# Patient Record
Sex: Female | Born: 1965 | Race: White | Hispanic: No | Marital: Married | Smoking: Never smoker
Health system: Southern US, Community
[De-identification: ages and names within clinical notes are randomized; demographics above are authoritative.]

## PROBLEM LIST (undated history)

## (undated) DIAGNOSIS — J302 Other seasonal allergic rhinitis: Secondary | ICD-10-CM

## (undated) DIAGNOSIS — M1990 Unspecified osteoarthritis, unspecified site: Secondary | ICD-10-CM

## (undated) HISTORY — PX: BREAST SURGERY: SHX581

---

## 2012-11-10 ENCOUNTER — Ambulatory Visit: Payer: Self-pay

## 2012-11-10 LAB — PREGNANCY, URINE: Pregnancy Test, Urine: NEGATIVE m[IU]/mL
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
## 2015-03-01 ENCOUNTER — Ambulatory Visit
Admission: EM | Admit: 2015-03-01 | Discharge: 2015-03-01 | Disposition: A | Discharge status: Home / Self Care | Attending: Family Medicine | Admitting: Family Medicine

## 2015-03-01 ENCOUNTER — Encounter: Payer: Self-pay | Admitting: Emergency Medicine

## 2015-03-01 DIAGNOSIS — L259 Unspecified contact dermatitis, unspecified cause: Secondary | ICD-10-CM

## 2015-03-01 MED ORDER — RANITIDINE HCL 150 MG PO CAPS: 150.0000 mg | ORAL_CAPSULE | Freq: Two times a day (BID) | ORAL | Status: DC

## 2015-03-01 MED ORDER — PREDNISONE 10 MG (21) PO TBPK: ORAL_TABLET | ORAL | Status: DC

## 2015-03-01 MED ORDER — METHYLPREDNISOLONE SODIUM SUCC 125 MG IJ SOLR
125.0000 mg | Freq: Once | INTRAMUSCULAR | Status: AC
  Administered 2015-03-01: 125 mg via INTRAMUSCULAR

## 2015-03-01 MED ORDER — LORATADINE 10 MG PO TABS: 10.0000 mg | ORAL_TABLET | Freq: Every day | ORAL | Status: DC

## 2015-03-01 MED ORDER — LORATADINE 10 MG PO TABS: 10.0000 mg | ORAL_TABLET | Freq: Every day | ORAL | Status: AC

## 2015-03-01 NOTE — ED Notes (Signed)
Patient states that she has been treated for poison ivy since last Tuesday with Prednisone and Benadryl and states that it will not go away and has gotten worse.  Patient has poison ivy on her arms and hands.

## 2015-03-01 NOTE — ED Provider Notes (Addendum)
CSN: [id number]     Arrival date & time 03/01/15  0857 History   First MD Initiated Contact with Patient 03/01/15 [phone]    Nurses notes were reviewed. Chief Complaint  Patient presents with  . Poison Ivy    Patient reports over week ago being exposed to poison ivy poison oak in the field. She is visiting her father in [place]. She went to an local urgent care and they placed her on a six-day course of prednisone (40-60 mg. Once the prednisone was through the contact dermatitis came back with a vengeance. She was seen at the urgent care on Saturday giving a shot of prednisone she has provided much and then placed on 40 mg prednisone a day for about another 6 days and states it has really touched anything to help anything this time. She is states she is very allergic to poison ivy and poison oak. She states she is miserable the rash is continuing to spread.  (Consider location/radiation/quality/duration/timing/severity/associated sxs/prior Treatment) Patient is a 50 y.o. female presenting with poison ivy. The history is provided by the patient. No language interpreter was used.  Poison Lajoyce Cornersvy This is a new problem. The current episode started more than 1 week ago. The problem occurs constantly. The problem has been gradually worsening. Pertinent negatives include no chest pain, no abdominal pain and no headaches. Nothing aggravates the symptoms. Relieved by: Prednisone initially helped. Treatments tried: Prescribe steroids.    History reviewed. No pertinent past medical history. History reviewed. No pertinent past surgical history. History reviewed. No pertinent family history. Social History  Substance Use Topics  . Smoking status: Never Smoker   . Smokeless tobacco: None  . Alcohol Use: No   OB History    No data available     Review of Systems  Cardiovascular: Negative for chest pain.  Gastrointestinal: Negative for abdominal pain.  Neurological: Negative for headaches.  All other  systems reviewed and are negative.   Allergies  Review of patient's allergies indicates no known allergies.  Home Medications   Prior to Admission medications   Medication Sig Start Date End Date Taking? Authorizing Provider  loratadine (CLARITIN) 10 MG tablet Take 1 tablet (10 mg total) by mouth daily. Take 1 tablet in the morning. As needed for itching. 03/01/15   Hassan RowanEugene Lilyanah Celestin, MD  predniSONE (STERAPRED UNI-PAK 21 TAB) 10 MG (21) TBPK tablet 6 tabs day 1 and 2, 5 tabs day 3 and 4, 4 tabs day 5 and 6, 3 tabs day 7 and 8, 2 tabs day 9 and 10, 1 tab day 11 and 12. Take orally 03/01/15   Hassan RowanEugene Richele Strand, MD  ranitidine (ZANTAC) 150 MG capsule Take 1 capsule (150 mg total) by mouth 2 (two) times daily. 03/01/15   Hassan RowanEugene Marquavis Hannen, MD   Meds Ordered and Administered this Visit   Medications  methylPREDNISolone sodium succinate (SOLU-MEDROL) 125 mg/2 mL injection 125 mg (not administered)    BP 116/83 mmHg  Pulse 73  Temp(Src) 97 F (36.1 C) (Tympanic)  Resp 16  Ht 5\' 8"  (1.727 m)  Wt 199 lb (90.266 kg)  BMI 30.26 kg/m2  SpO2 100%  LMP 01/30/2015 (Exact Date) No data found.   Physical Exam  Constitutional: She is oriented to person, place, and time. She appears well-developed and well-nourished.  HENT:  Head: Normocephalic and atraumatic.  Eyes: Pupils are equal, round, and reactive to light.  Neck: Normal range of motion.  Musculoskeletal: Normal range of motion.  Neurological: She is alert and  oriented to person, place, and time. No cranial nerve deficit.  Skin: Rash noted. There is erythema.     Psychiatric: She has a normal mood and affect. Her behavior is normal.  Vitals reviewed.   ED Course  Procedures (including critical care time)  Labs Review Labs Reviewed - No data to display  Imaging Review No results found.   Visual Acuity Review  Right Eye Distance:   Left Eye Distance:   Bilateral Distance:    Right Eye Near:   Left Eye Near:    Bilateral Near:          MDM   1. Contact dermatitis    Will administer 125 mg of Solu-Medrol IM now. Place on 12 days decreasing dose of prednisone. Strongly suggest that the future she lets providers know that six-day course of prednisone will not be adequate to treat contact dermatitis. Will also place her on Zantac 150 twice a day and Claritin 10 mg morning and she can take Vistaril at night or Zyrtec at night but cannot take Benadryl. Work note for today and tomorrow be given as well. I've asked her to come back Friday for recheck to make sure that we her improving the situation.    Hassan Rowan, MD 03/01/15 1610  Hassan Rowan, MD 03/01/15 [phone]

## 2015-03-01 NOTE — Discharge Instructions (Signed)
Contact Dermatitis °Dermatitis is redness, soreness, and swelling (inflammation) of the skin. Contact dermatitis is a reaction to certain substances that touch the skin. You either touched something that irritated your skin, or you have allergies to something you touched.  °HOME CARE  °Skin Care °· Moisturize your skin as needed. °· Apply cool compresses to the affected areas.   °· Try taking a bath with:   °· Epsom salts. Follow the instructions on the package. You can get these at a pharmacy or grocery store.   °· Baking soda. Pour a small amount into the bath as told by your doctor.   °· Colloidal oatmeal. Follow the instructions on the package. You can get this at a pharmacy or grocery store.   °· Try applying baking soda paste to your skin. Stir water into baking soda until it looks like paste. °· Do not scratch your skin.   °· Bathe less often. °· Bathe in lukewarm water. Avoid using hot water.   °Medicines °· Take or apply over-the-counter and prescription medicines only as told by your doctor.   °· If you were prescribed an antibiotic medicine, take or apply your antibiotic as told by your doctor. Do not stop taking the antibiotic even if your condition starts to get better. °General Instructions °· Keep all follow-up visits as told by your doctor. This is important.   °· Avoid the substance that caused your reaction. If you do not know what caused it, keep a journal to try to track what caused it. Write down:   °· What you eat.   °· What cosmetic products you use.   °· What you drink.   °· What you wear in the affected area. This includes jewelry.   °· If you were given a bandage (dressing), take care of it as told by your doctor. This includes when to change and remove it.   °GET HELP IF:  °· You do not get better with treatment.   °· Your condition gets worse.   °· You have signs of infection such as: °¨ Swelling. °¨ Tenderness. °¨ Redness. °¨ Soreness. °¨ Warmth.   °· You have a fever.   °· You have new  symptoms.   °GET HELP RIGHT AWAY IF:  °· You have a very bad headache. °· You have neck pain. °· Your neck is stiff.    °· You throw up (vomit).   °· You feel very sleepy.   °· You see red streaks coming from the affected area.   °· Your bone or joint underneath the affected area becomes painful after the skin has healed.   °· The affected area turns darker.   °· You have trouble breathing.   °  °This information is not intended to replace advice given to you by your health care provider. Make sure you discuss any questions you have with your health care provider. °  °Document Released: 12/10/2008 Document Revised: 11/03/2014 Document Reviewed: 06/30/2014 °[employer] Interactive Patient Education ©2016 [employer] ° °Rash °A rash is a change in the color or feel of your skin. There are many different types of rashes. You may have other problems along with your rash. °HOME CARE °· Avoid the thing that caused your rash. °· Do not scratch your rash. °· You may take cools baths to help stop itching. °· Only take medicines as told by your doctor. °· Keep all doctor visits as told. °GET HELP RIGHT AWAY IF:  °· Your pain, puffiness (swelling), or redness gets worse. °· You have a fever. °· You have new or severe problems. °· You have body aches, watery poop (diarrhea), or you throw   up (vomit). °· Your rash is not better after 3 days. °MAKE SURE YOU:  °· Understand these instructions. °· Will watch your condition. °· Will get help right away if you are not doing well or get worse. °  °This information is not intended to replace advice given to you by your health care provider. Make sure you discuss any questions you have with your health care provider. °  °Document Released: 08/01/2007 Document Revised: 05/07/2011 Document Reviewed: 06/30/2014 °[employer] Interactive Patient Education ©2016 [employer] ° °

## 2016-07-30 ENCOUNTER — Telehealth: Payer: Self-pay | Admitting: Emergency Medicine

## 2016-07-30 ENCOUNTER — Ambulatory Visit
Admission: EM | Admit: 2016-07-30 | Discharge: 2016-07-30 | Disposition: A | Discharge status: Home / Self Care | Attending: Family Medicine | Admitting: Family Medicine

## 2016-07-30 ENCOUNTER — Encounter: Payer: Self-pay | Admitting: Emergency Medicine

## 2016-07-30 ENCOUNTER — Ambulatory Visit
Admit: 2016-07-30 | Discharge: 2016-07-30 | Disposition: A | Discharge status: Home / Self Care | Source: Ambulatory Visit | Attending: Family Medicine | Admitting: Family Medicine

## 2016-07-30 DIAGNOSIS — R102 Pelvic and perineal pain: Secondary | ICD-10-CM | POA: Insufficient documentation

## 2016-07-30 DIAGNOSIS — Z975 Presence of (intrauterine) contraceptive device: Secondary | ICD-10-CM | POA: Insufficient documentation

## 2016-07-30 DIAGNOSIS — N730 Acute parametritis and pelvic cellulitis: Secondary | ICD-10-CM | POA: Diagnosis not present

## 2016-07-30 DIAGNOSIS — R109 Unspecified abdominal pain: Secondary | ICD-10-CM

## 2016-07-30 LAB — CBC WITH DIFFERENTIAL/PLATELET
Basophils Absolute: 0.1 10*3/uL (ref 0–0.1)
Basophils Relative: 1 %
EOS ABS: 0.2 10*3/uL (ref 0–0.7)
EOS PCT: 2 %
HCT: 38.2 % (ref 35.0–47.0)
Hemoglobin: 13.1 g/dL (ref 12.0–16.0)
LYMPHS ABS: 2 10*3/uL (ref 1.0–3.6)
LYMPHS PCT: 19 %
MCH: 29.7 pg (ref 26.0–34.0)
MCHC: 34.2 g/dL (ref 32.0–36.0)
MCV: 87 fL (ref 80.0–100.0)
MONO ABS: 1 10*3/uL — AB (ref 0.2–0.9)
MONOS PCT: 10 %
Neutro Abs: 6.9 10*3/uL — ABNORMAL HIGH (ref 1.4–6.5)
Neutrophils Relative %: 68 %
PLATELETS: 218 10*3/uL (ref 150–440)
RBC: 4.4 MIL/uL (ref 3.80–5.20)
RDW: 13.6 % (ref 11.5–14.5)
WBC: 10.2 10*3/uL (ref 3.6–11.0)

## 2016-07-30 LAB — URINALYSIS, COMPLETE (UACMP) WITH MICROSCOPIC
BACTERIA UA: NONE SEEN
Bilirubin Urine: NEGATIVE
GLUCOSE, UA: NEGATIVE mg/dL
KETONES UR: NEGATIVE mg/dL
LEUKOCYTES UA: NEGATIVE
Nitrite: NEGATIVE
Protein, ur: NEGATIVE mg/dL
Specific Gravity, Urine: 1.02 (ref 1.005–1.030)
pH: 7 (ref 5.0–8.0)

## 2016-07-30 LAB — PREGNANCY, URINE: Preg Test, Ur: NEGATIVE

## 2016-07-30 LAB — WET PREP, GENITAL
CLUE CELLS WET PREP: NONE SEEN
SPERM: NONE SEEN
TRICH WET PREP: NONE SEEN
WBC WET PREP: NONE SEEN
YEAST WET PREP: NONE SEEN

## 2016-07-30 LAB — CHLAMYDIA/NGC RT PCR (ARMC ONLY)
Chlamydia Tr: NOT DETECTED
N gonorrhoeae: NOT DETECTED

## 2016-07-30 MED ORDER — METRONIDAZOLE 500 MG PO TABS: 500.0000 mg | ORAL_TABLET | Freq: Two times a day (BID) | ORAL | 0 refills | Status: DC

## 2016-07-30 MED ORDER — CEFTRIAXONE SODIUM 1 G IJ SOLR
1.0000 g | Freq: Once | INTRAMUSCULAR | Status: AC
  Administered 2016-07-30: 1 g via INTRAMUSCULAR

## 2016-07-30 MED ORDER — DOXYCYCLINE HYCLATE 100 MG PO CAPS: 100.0000 mg | ORAL_CAPSULE | Freq: Two times a day (BID) | ORAL | 0 refills | Status: DC

## 2016-07-30 MED ORDER — HYDROCODONE-ACETAMINOPHEN 5-325 MG PO TABS: 1.0000 | ORAL_TABLET | Freq: Three times a day (TID) | ORAL | 0 refills | Status: DC | PRN

## 2016-07-30 MED ORDER — DOXYCYCLINE HYCLATE 100 MG PO CAPS
100.0000 mg | ORAL_CAPSULE | Freq: Two times a day (BID) | ORAL | 0 refills | Status: DC
Start: 2016-07-30 — End: 2016-07-30

## 2016-07-30 NOTE — ED Notes (Signed)
Patient shows no signs of adverse reaction to medication at this time.  

## 2016-07-30 NOTE — ED Triage Notes (Signed)
Patient states she has been having abdominal pain x 2 days which is worse with movement

## 2016-07-30 NOTE — ED Provider Notes (Addendum)
[facility]    CSN: [id number] Arrival date & time: 07/30/16  1529     History   Chief Complaint Chief Complaint  Patient presents with  . Abdominal Pain    HPI Catherine Byrd is a 51 y.o. female.   Patient reports having lower abdominal pain on the left side that started this morning. She reports no problem yesterday. No history of medical problems. No history of diverticulitis. She denies any history of ovarian cyst or ovarian torsion pelvic inflammatory disease or discharge. She was last sexually active Saturday night usual partner and she states that Monday morning she started having this abdominal pain was sharp and severe. She denies any drug allergies. Habits she does not smoke. She is retired from [employer]. She has had hip surgery for the past. Mother said cancer mother is also had torsion of the ovaries and her sisters had ovarian cyst before the past. She never did smoke. She reports no problem with her appetite she's had some nausea but no vomiting. No fever.   The history is provided by the patient. No language interpreter was used.  Abdominal Pain  Pain location:  Suprapubic Pain quality: sharp, stabbing and throbbing   Pain radiates to:  Does not radiate Pain severity:  Severe Onset quality:  Sudden Duration:  1 day Timing:  Constant Progression:  Worsening Chronicity:  New Context: recent sexual activity   Context: not awakening from sleep, not medication withdrawal, not previous surgeries, not recent illness, not sick contacts and not suspicious food intake   Relieved by:  Nothing Ineffective treatments:  None tried Associated symptoms: no dysuria, no fever, no flatus, no vaginal bleeding and no vaginal discharge     History reviewed. No pertinent past medical history.  There are no active problems to display for this patient.   History reviewed. No pertinent surgical history.  OB History    No data available       Home Medications     Prior to Admission medications   Medication Sig Start Date End Date Taking? Authorizing Provider  doxycycline (VIBRAMYCIN) 100 MG capsule Take 1 capsule (100 mg total) by mouth 2 (two) times daily. 07/30/16   Hassan Rowan, MD  HYDROcodone-acetaminophen (NORCO) 5-325 MG tablet Take 1 tablet by mouth every 8 (eight) hours as needed for moderate pain. 07/30/16   Hassan Rowan, MD  loratadine (CLARITIN) 10 MG tablet Take 1 tablet (10 mg total) by mouth daily. Take 1 tablet in the morning. As needed for itching. 03/01/15   Hassan Rowan, MD  metroNIDAZOLE (FLAGYL) 500 MG tablet Take 1 tablet (500 mg total) by mouth 2 (two) times daily. 07/30/16   Hassan Rowan, MD  predniSONE (STERAPRED UNI-PAK 21 TAB) 10 MG (21) TBPK tablet 6 tabs day 1 and 2, 5 tabs day 3 and 4, 4 tabs day 5 and 6, 3 tabs day 7 and 8, 2 tabs day 9 and 10, 1 tab day 11 and 12. Take orally 03/01/15   Hassan Rowan, MD  ranitidine (ZANTAC) 150 MG capsule Take 1 capsule (150 mg total) by mouth 2 (two) times daily. 03/01/15   Hassan Rowan, MD    Family History Family History  Problem Relation Age of Onset  . Cancer Mother     Social History Social History  Substance Use Topics  . Smoking status: Never Smoker  . Smokeless tobacco: Never Used  . Alcohol use No     Allergies   Patient has no known  allergies.   Review of Systems Review of Systems  Constitutional: Negative for fever.  Gastrointestinal: Positive for abdominal pain. Negative for flatus.  Genitourinary: Positive for pelvic pain. Negative for dysuria, vaginal bleeding and vaginal discharge.  All other systems reviewed and are negative.    Physical Exam Triage Vital Signs ED Triage Vitals  Enc Vitals Group     BP 07/30/16 1603 115/70     Pulse Rate 07/30/16 1603 91     Resp 07/30/16 1603 18     Temp 07/30/16 1603 99.2 F (37.3 C)     Temp src --      SpO2 07/30/16 1603 99 %     Weight 07/30/16 1557 221 lb (100.2 kg)     Height 07/30/16 1557 5\' 8"  (1.727 m)      Head Circumference --      Peak Flow --      Pain Score 07/30/16 1557 6     Pain Loc --      Pain Edu? --      Excl. in GC? --    No data found.   Updated Vital Signs BP 115/70   Pulse 91   Temp 99.2 F (37.3 C)   Resp 18   Ht 5\' 8"  (1.727 m)   Wt 221 lb (100.2 kg)   SpO2 99%   BMI 33.60 kg/m   Visual Acuity Right Eye Distance:   Left Eye Distance:   Bilateral Distance:    Right Eye Near:   Left Eye Near:    Bilateral Near:     Physical Exam  Constitutional: She is oriented to person, place, and time. She appears well-developed and well-nourished.  HENT:  Head: Normocephalic and atraumatic.  Right Ear: External ear normal.  Left Ear: External ear normal.  Mouth/Throat: Oropharynx is clear and moist.  Eyes: Pupils are equal, round, and reactive to light.  Neck: Normal range of motion. Neck supple.  Cardiovascular: Normal rate, regular rhythm and normal heart sounds.   Pulmonary/Chest: Effort normal and breath sounds normal.  Abdominal: Soft. There is tenderness. Hernia confirmed negative in the right inguinal area and confirmed negative in the left inguinal area.  Genitourinary: Rectum normal. Rectal exam shows no external hemorrhoid and no fissure. There is no rash or tenderness on the right labia. There is no rash or tenderness on the left labia. Uterus is tender. Cervix exhibits discharge. Cervix exhibits no motion tenderness and no friability. Right adnexum displays no mass, no tenderness and no fullness. Left adnexum displays tenderness and fullness. Left adnexum displays no mass. Vaginal discharge found.  Genitourinary Comments: Patient with marked left adnexal tenderness and pain and depression cervix. Rectal examination also confirms tenderness over the left adnexal area as well. IUD was present and discharged present around IUD  Musculoskeletal: Normal range of motion. She exhibits no edema.  Neurological: She is alert and oriented to person, place, and time.    Skin: Skin is warm and dry.  Psychiatric: She has a normal mood and affect.  Vitals reviewed.    UC Treatments / Results  Labs (all labs ordered are listed, but only abnormal results are displayed) Labs Reviewed  URINALYSIS, COMPLETE (UACMP) WITH MICROSCOPIC - Abnormal; Notable for the following:       Result Value   Hgb urine dipstick TRACE (*)    Squamous Epithelial / LPF 0-5 (*)    All other components within normal limits  CBC WITH DIFFERENTIAL/PLATELET - Abnormal; Notable for the following:  Neutro Abs 6.9 (*)    Monocytes Absolute 1.0 (*)    All other components within normal limits  WET PREP, GENITAL  CHLAMYDIA/NGC RT PCR ([facility] ONLY)  PREGNANCY, URINE    EKG  EKG Interpretation None       Radiology US Transvaginal Non-ob  Result Date: 07/30/2016 CLINICAL DATA:  Left pelvic pain. EXAM: TRANSABDOMINAL AND TRANSVAGINAL ULTRASOUND OF PELVIS DOPPLER ULTRASOUND OF OVARIES TECHNIQUE: Both transabdominal and transvaginal ultrasound examinations of the pelvis were performed. Transabdominal technique was performed for global imaging of the pelvis including uterus, ovaries, adnexal regions, and pelvic cul-de-sac. It was necessary to proceed with endovaginal exam following the transabdominal exam to visualize the endometrium, ovaries, and adnexum. Color and duplex Doppler ultrasound was utilized to evaluate blood flow to the ovaries. COMPARISON:  None. FINDINGS: Uterus Measurements: 9.3 x 4.6 x 5.6 cm. 1 cm hypoechoic lesion in the uterine fundus consistent with a small fibroid. Endometrium Thickness: 5.2 mm.  IUD appears to be in the uterine fundus. Right ovary Measurements: 2.7 x 1.5 x 1.4 cm. Normal appearance/no adnexal mass. Left ovary Measurements: 4.2 x 2.6 x 2.4 cm. Normal appearance/no adnexal mass. Pulsed Doppler evaluation of both ovaries demonstrates normal low-resistance arterial and venous waveforms. Other findings Small amount of free fluid in the pelvis.  IMPRESSION: IUD in the uterine fundus. Otherwise normal exam. Normal perfusion to both ovaries. Electronically Signed   By: Francene Boyers M.D.   On: 07/30/2016 19:50   US Pelvis Complete  Result Date: 07/30/2016 CLINICAL DATA:  Left pelvic pain. EXAM: TRANSABDOMINAL AND TRANSVAGINAL ULTRASOUND OF PELVIS DOPPLER ULTRASOUND OF OVARIES TECHNIQUE: Both transabdominal and transvaginal ultrasound examinations of the pelvis were performed. Transabdominal technique was performed for global imaging of the pelvis including uterus, ovaries, adnexal regions, and pelvic cul-de-sac. It was necessary to proceed with endovaginal exam following the transabdominal exam to visualize the endometrium, ovaries, and adnexum. Color and duplex Doppler ultrasound was utilized to evaluate blood flow to the ovaries. COMPARISON:  None. FINDINGS: Uterus Measurements: 9.3 x 4.6 x 5.6 cm. 1 cm hypoechoic lesion in the uterine fundus consistent with a small fibroid. Endometrium Thickness: 5.2 mm.  IUD appears to be in the uterine fundus. Right ovary Measurements: 2.7 x 1.5 x 1.4 cm. Normal appearance/no adnexal mass. Left ovary Measurements: 4.2 x 2.6 x 2.4 cm. Normal appearance/no adnexal mass. Pulsed Doppler evaluation of both ovaries demonstrates normal low-resistance arterial and venous waveforms. Other findings Small amount of free fluid in the pelvis. IMPRESSION: IUD in the uterine fundus. Otherwise normal exam. Normal perfusion to both ovaries. Electronically Signed   By: Francene Boyers M.D.   On: 07/30/2016 19:50   [place] Art/ven Flow Abd Pelv Doppler  Result Date: 07/30/2016 CLINICAL DATA:  Left pelvic pain. EXAM: TRANSABDOMINAL AND TRANSVAGINAL ULTRASOUND OF PELVIS DOPPLER ULTRASOUND OF OVARIES TECHNIQUE: Both transabdominal and transvaginal ultrasound examinations of the pelvis were performed. Transabdominal technique was performed for global imaging of the pelvis including uterus, ovaries, adnexal regions, and pelvic cul-de-sac. It  was necessary to proceed with endovaginal exam following the transabdominal exam to visualize the endometrium, ovaries, and adnexum. Color and duplex Doppler ultrasound was utilized to evaluate blood flow to the ovaries. COMPARISON:  None. FINDINGS: Uterus Measurements: 9.3 x 4.6 x 5.6 cm. 1 cm hypoechoic lesion in the uterine fundus consistent with a small fibroid. Endometrium Thickness: 5.2 mm.  IUD appears to be in the uterine fundus. Right ovary Measurements: 2.7 x 1.5 x 1.4 cm. Normal appearance/no adnexal mass. Left  ovary Measurements: 4.2 x 2.6 x 2.4 cm. Normal appearance/no adnexal mass. Pulsed Doppler evaluation of both ovaries demonstrates normal low-resistance arterial and venous waveforms. Other findings Small amount of free fluid in the pelvis. IMPRESSION: IUD in the uterine fundus. Otherwise normal exam. Normal perfusion to both ovaries. Electronically Signed   By: Francene BoyersJames  Maxwell M.D.   On: 07/30/2016 19:50    Procedures Procedures (including critical care time)  Medications Ordered in UC Medications  cefTRIAXone (ROCEPHIN) injection 1 g (1 g Intramuscular Given 07/30/16 1720)   Results for orders placed or performed during the hospital encounter of 07/30/16  Wet prep, genital  Result Value Ref Range   Yeast Wet Prep HPF POC NONE SEEN NONE SEEN   Trich, Wet Prep NONE SEEN NONE SEEN   Clue Cells Wet Prep HPF POC NONE SEEN NONE SEEN   WBC, Wet Prep HPF POC NONE SEEN NONE SEEN   Sperm NONE SEEN   Urinalysis, Complete w Microscopic  Result Value Ref Range   Color, Urine YELLOW YELLOW   APPearance CLEAR CLEAR   Specific Gravity, Urine 1.020 1.005 - 1.030   pH 7.0 5.0 - 8.0   Glucose, UA NEGATIVE NEGATIVE mg/dL   Hgb urine dipstick TRACE (A) NEGATIVE   Bilirubin Urine NEGATIVE NEGATIVE   Ketones, ur NEGATIVE NEGATIVE mg/dL   Protein, ur NEGATIVE NEGATIVE mg/dL   Nitrite NEGATIVE NEGATIVE   Leukocytes, UA NEGATIVE NEGATIVE   Squamous Epithelial / LPF 0-5 (A) NONE SEEN   WBC, UA  0-5 0 - 5 WBC/hpf   RBC / HPF 0-5 0 - 5 RBC/hpf   Bacteria, UA NONE SEEN NONE SEEN  CBC with Differential  Result Value Ref Range   WBC 10.2 3.6 - 11.0 K/uL   RBC 4.40 3.80 - 5.20 MIL/uL   Hemoglobin 13.1 12.0 - 16.0 g/dL   HCT 21.338.2 08.635.0 - 57.847.0 %   MCV 87.0 80.0 - 100.0 fL   MCH 29.7 26.0 - 34.0 pg   MCHC 34.2 32.0 - 36.0 g/dL   RDW 46.913.6 62.911.5 - 52.814.5 %   Platelets 218 150 - 440 K/uL   Neutrophils Relative % 68 %   Neutro Abs 6.9 (H) 1.4 - 6.5 K/uL   Lymphocytes Relative 19 %   Lymphs Abs 2.0 1.0 - 3.6 K/uL   Monocytes Relative 10 %   Monocytes Absolute 1.0 (H) 0.2 - 0.9 K/uL   Eosinophils Relative 2 %   Eosinophils Absolute 0.2 0 - 0.7 K/uL   Basophils Relative 1 %   Basophils Absolute 0.1 0 - 0.1 K/uL  Pregnancy, urine  Result Value Ref Range   Preg Test, Ur NEGATIVE NEGATIVE    Initial Impression / Assessment and Plan / UC Course  I have reviewed the triage vital signs and the nursing notes.  Pertinent labs & imaging results that were available during my care of the patient were reviewed by me and considered in my medical decision making (see chart for details).    Prince test negative caries were prep was negative as well GC and Chlamydia pending [facility]. To rule out torsion and ovarian cyst but right now diagnosis will be PID will administer 1 gram of Rocephin place on Doxy and Flagyl for 2 weeks work note for today and tomorrow and have follow-up with her PCP or GYN next week   Final Clinical Impressions(s) / UC Diagnoses   Final diagnoses:  Pelvic pain in female  PID (  acute pelvic inflammatory disease)    New Prescriptions Discharge Medication List as of 07/30/2016  5:19 PM    START taking these medications   Details  HYDROcodone-acetaminophen (NORCO) 5-325 MG tablet Take 1 tablet by mouth every 8 (eight) hours as needed for moderate pain., Starting Mon 07/30/2016, Normal    doxycycline (VIBRAMYCIN) 100 MG capsule Take 1  capsule (100 mg total) by mouth 2 (two) times daily., Starting Mon 07/30/2016, Normal    metroNIDAZOLE (FLAGYL) 500 MG tablet Take 1 tablet (500 mg total) by mouth 2 (two) times daily., Starting Mon 07/30/2016, Normal      Should be noted that patient was checked at the [facility] drug reporting system and no signs of any recent or pending narcotic prescriptions were found     Note: This dictation was prepared with Dragon dictation along with smaller phrase technology. Any transcriptional errors that result from this process are unintentional.   Hassan Rowan, MD 07/30/16 1756    Hassan Rowan, MD 07/30/16 1804 Unfortunately did not receive cough ultrasound results of the patient's ultrasound. No sniffing findings were noted except for small bowel fluid in the cul-de-sac. Feel comfortable with the diagnosis of PID and will recommend to the patient that she continue taking the antibiotics as instructed and the pain medicine as needed.   Hassan Rowan, MD 07/30/16 Corky Crafts

## 2016-07-30 NOTE — Telephone Encounter (Signed)
Patient was notified of her Ultrasound results.  Patient was instructed to continue with the antibiotics for PID and to follow-up with her PCP per Dr. Thurmond ButtsWade reviewing patient's US results.  Patient verbalized understanding.

## 2018-07-20 IMAGING — US US ART/VEN ABD/PELV/SCROTUM DOPPLER LTD
1 series · 13 of 25 positions shown · non-contrast
Comparison: None.

CLINICAL DATA: Left pelvic pain.

EXAM:
TRANSABDOMINAL AND TRANSVAGINAL ULTRASOUND OF PELVIS
DOPPLER ULTRASOUND OF OVARIES
TECHNIQUE: Both transabdominal and transvaginal ultrasound examinations of the
pelvis were performed. Transabdominal technique was performed for
global imaging of the pelvis including uterus, ovaries, adnexal
regions, and pelvic cul-de-sac.
It was necessary to proceed with endovaginal exam following the
transabdominal exam to visualize the endometrium, ovaries, and
adnexum. Color and duplex Doppler ultrasound was utilized to
evaluate blood flow to the ovaries.

[Series 1: us art/ven abd/pelv/scrotum doppler ltd · 0.25mm/px · 13 of 149 slices shown]
[im 1/149]
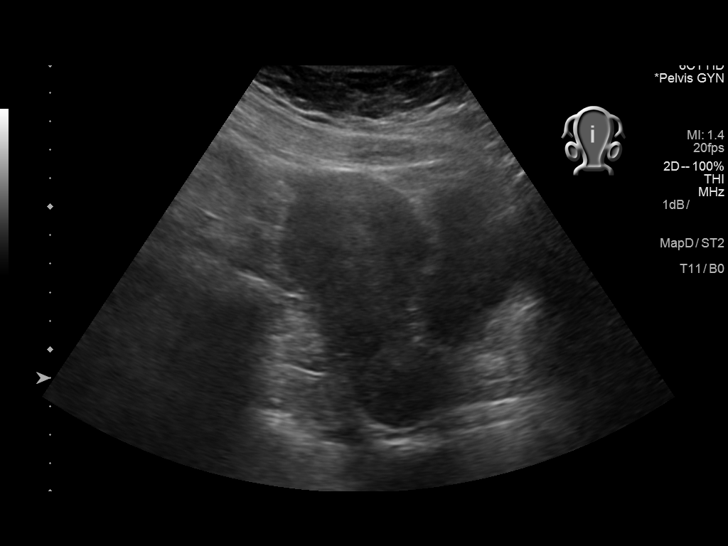
[im 13/149]
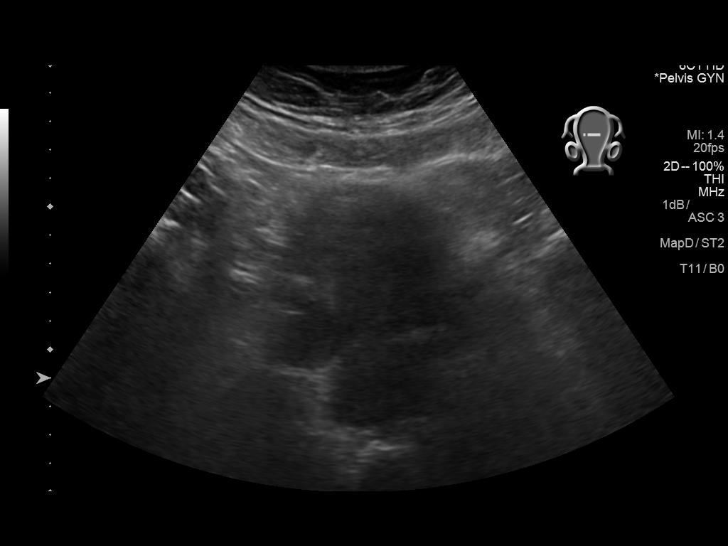
[im 25/149]
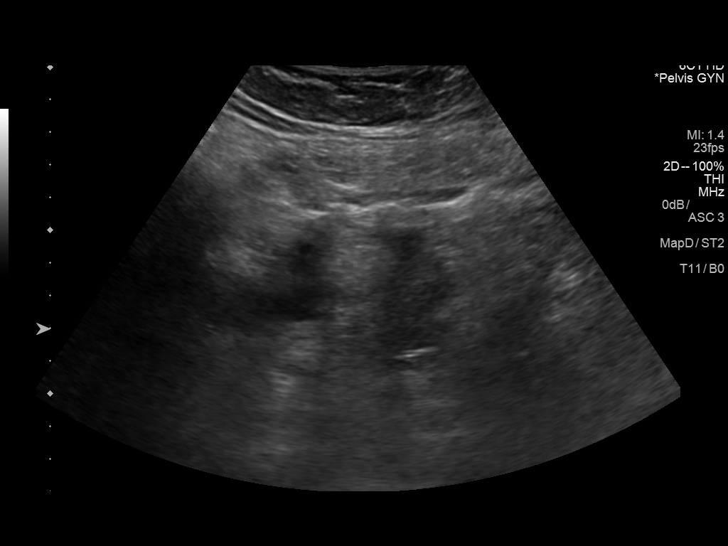
[im 38/149]
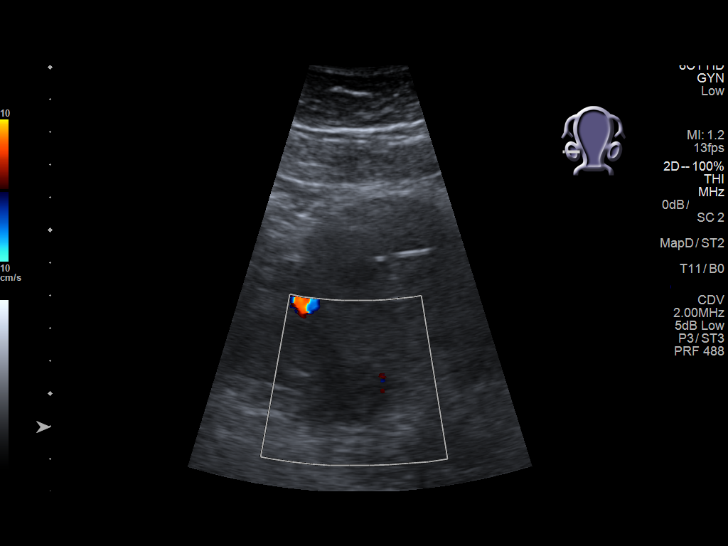
[im 50/149]
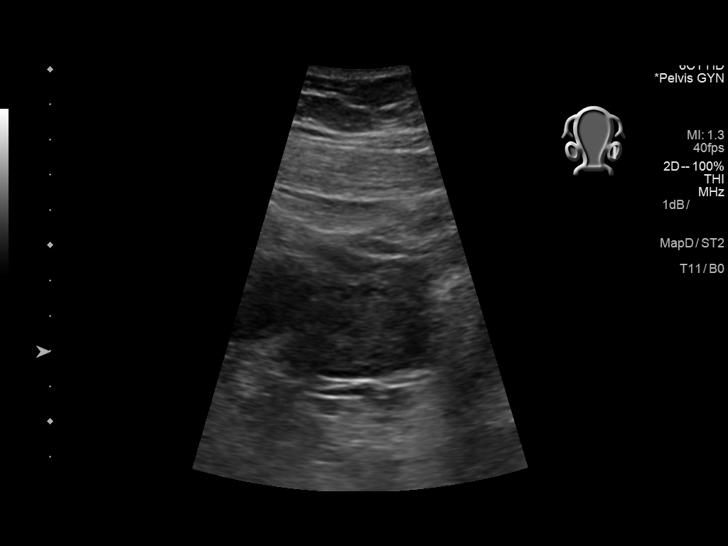
[im 62/149]
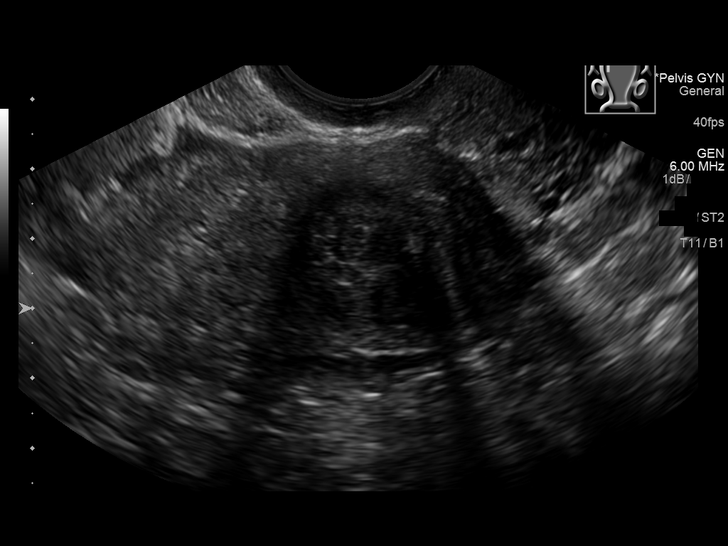
[im 75/149]
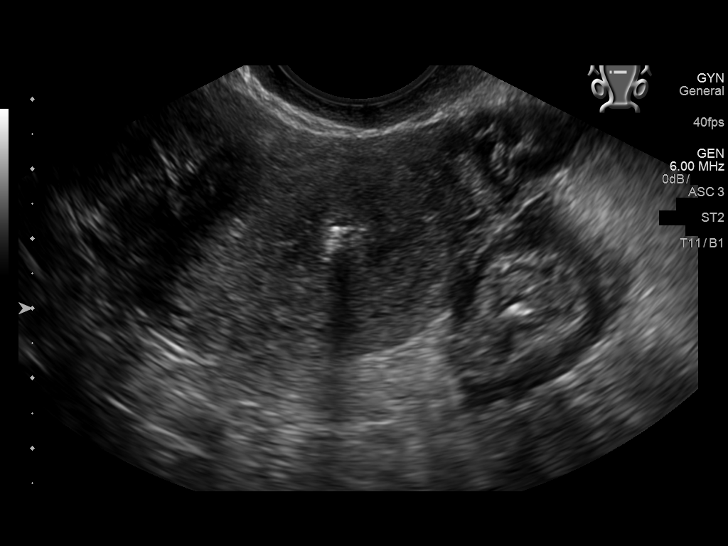
[im 87/149]
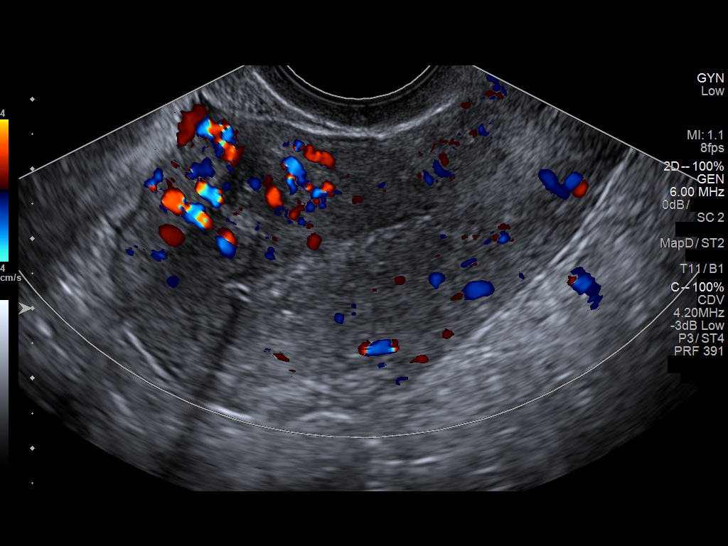
[im 99/149]
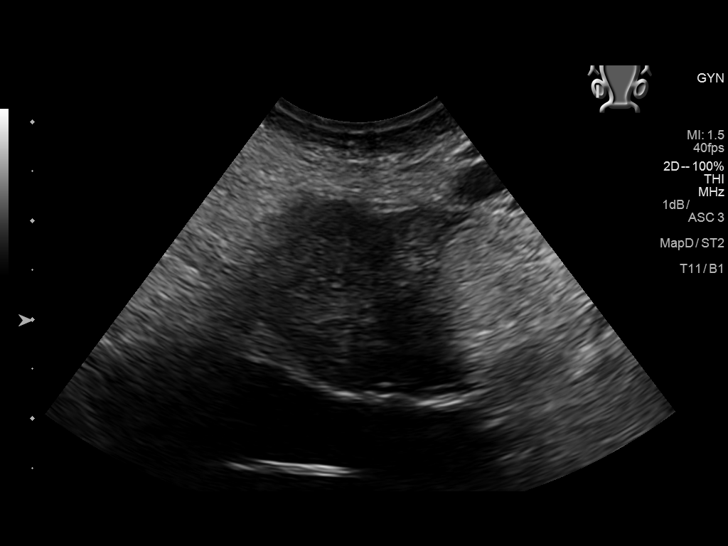
[im 112/149]
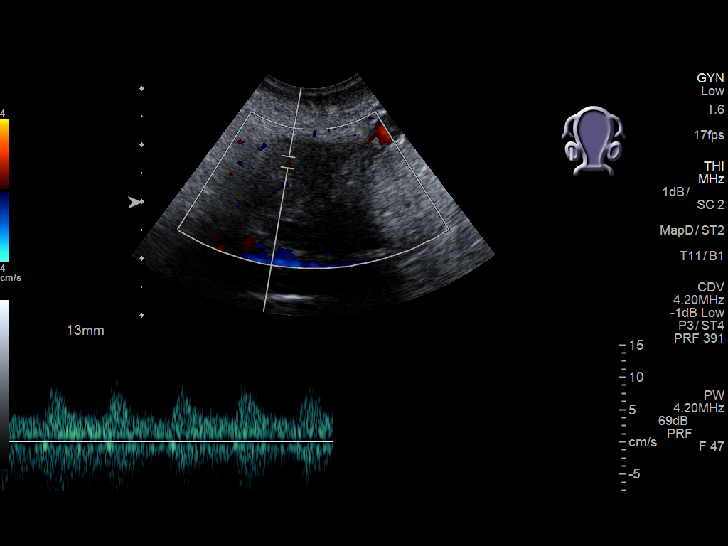
[im 124/149]
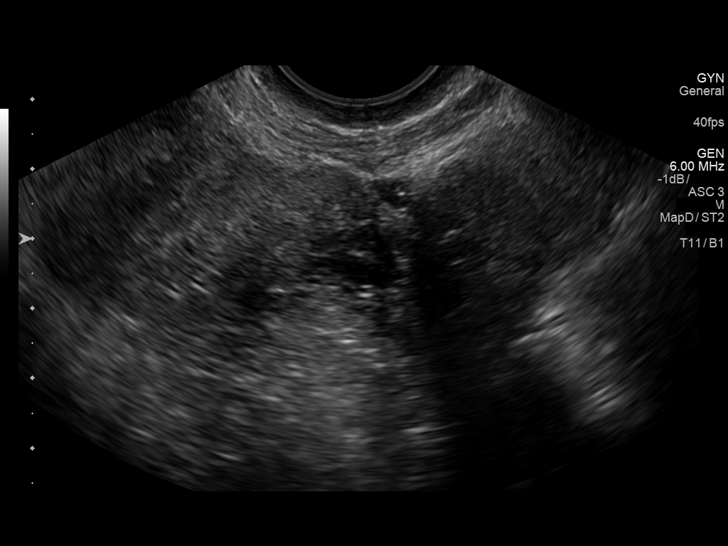
[im 136/149]
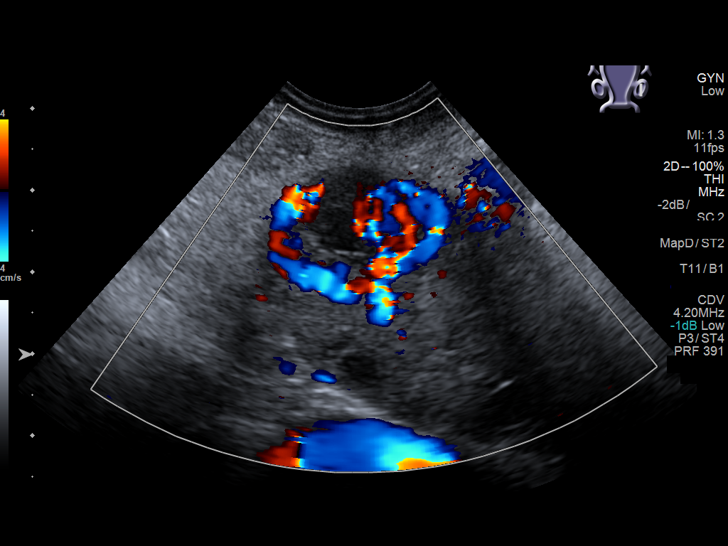
[im 149/149]
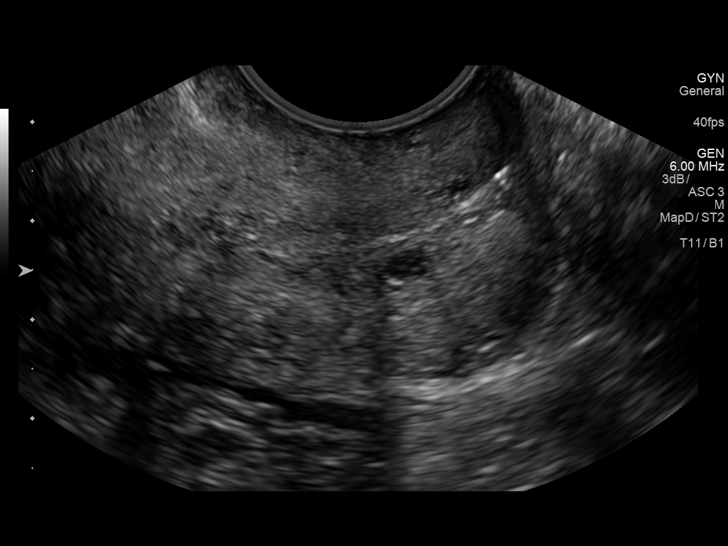

[13 of 25 positions shown; findings below may reference images not displayed]

FINDINGS: Uterus

Measurements: 9.3 x 4.6 x 5.6 cm. 1 cm hypoechoic lesion in the
uterine fundus consistent with a small fibroid.

Endometrium

Thickness: 5.2 mm.  IUD appears to be in the uterine fundus.

Right ovary

Measurements: 2.7 x 1.5 x 1.4 cm. Normal appearance/no adnexal mass.

Left ovary

Measurements: 4.2 x 2.6 x 2.4 cm. Normal appearance/no adnexal mass.

Pulsed Doppler evaluation of both ovaries demonstrates normal
low-resistance arterial and venous waveforms.

Other findings

Small amount of free fluid in the pelvis.
IMPRESSION: IUD in the uterine fundus. Otherwise normal exam. Normal perfusion
to both ovaries.

## 2018-11-08 ENCOUNTER — Other Ambulatory Visit: Payer: Self-pay

## 2018-11-08 ENCOUNTER — Ambulatory Visit
Admission: EM | Admit: 2018-11-08 | Discharge: 2018-11-08 | Disposition: A | Discharge status: Home / Self Care | Attending: Family | Admitting: Family

## 2018-11-08 ENCOUNTER — Encounter: Payer: Self-pay | Admitting: Emergency Medicine

## 2018-11-08 DIAGNOSIS — L237 Allergic contact dermatitis due to plants, except food: Secondary | ICD-10-CM

## 2018-11-08 MED ORDER — PREDNISONE 10 MG (21) PO TBPK: ORAL_TABLET | Freq: Every day | ORAL | 0 refills | Status: DC

## 2018-11-08 NOTE — ED Provider Notes (Signed)
[facility]    CSN: [id number] Arrival date & time: 11/08/18  1417      History   Chief Complaint Chief Complaint  Patient presents with   Rash    HPI Catherine Byrd is a 53 y.o. adult.   53 year old female presents with rash on the inside of both elbows and inner arms that started about 4 days ago. Usually gets exposed to poison ivy/o[place] at least once a year. Thinks it may be in the hay that she has handled. Started on her right inner elbow area and now has spread to the other arm and a few spots on her abdomen. No other lesions yet on other parts of her body. Usually has to take a 12 day course of Prednisone and occasional steroid shot for resolution. Denies any fever, difficulty swallowing, shortness of breath, wheezing, cough or GI symptoms. Has tried applying Ivyrest, Aveeno and Triamcinolone cream to area with minimal relief. Other chronic health issue is environmental allergies and currently on Claritin daily.   The history is provided by the patient.    History reviewed. No pertinent past medical history.  There are no active problems to display for this patient.   Past Surgical History:  Procedure Laterality Date   NO PAST SURGERIES      OB History   No obstetric history on file.      Home Medications    Prior to Admission medications   Medication Sig Start Date End Date Taking? Authorizing Provider  loratadine (CLARITIN) 10 MG tablet Take 1 tablet (10 mg total) by mouth daily. Take 1 tablet in the morning. As needed for itching. 03/01/15   Frederich Cha, MD  predniSONE (STERAPRED UNI-PAK 21 TAB) 10 MG (21) TBPK tablet Take by mouth daily. Take 6 tabs by mouth daily for 2 days, then decrease by 1 tablet every 2 days until finished on day 12 11/08/18   Katy Apo, NP  ranitidine (ZANTAC) 150 MG capsule Take 1 capsule (150 mg total) by mouth 2 (two) times daily. 03/01/15 11/08/18  Frederich Cha, MD    Family History Family History   Problem Relation Age of Onset   Cancer Mother    Healthy Father     Social History Social History   Tobacco Use   Smoking status: Never Smoker   Smokeless tobacco: Never Used  Substance Use Topics   Alcohol use: No   Drug use: Not Currently     Allergies   Patient has no known allergies.   Review of Systems Review of Systems  Constitutional: Negative for activity change, appetite change, chills, fatigue and fever.  HENT: Negative for congestion, facial swelling, mouth sores, sore throat and trouble swallowing.   Eyes: Negative for pain, discharge, redness and itching.  Respiratory: Negative for cough, chest tightness, shortness of breath, wheezing and stridor.   Gastrointestinal: Negative for abdominal pain, nausea and vomiting.  Musculoskeletal: Negative for arthralgias, myalgias, neck pain and neck stiffness.  Skin: Positive for rash. Negative for wound.  Allergic/Immunologic: Positive for environmental allergies. Negative for immunocompromised state.  Neurological: Negative for dizziness, tremors, seizures, syncope, weakness, light-headedness, numbness and headaches.  Hematological: Negative for adenopathy. Does not bruise/bleed easily.     Physical Exam Triage Vital Signs ED Triage Vitals  Enc Vitals Group     BP 11/08/18 1431 120/86     Pulse Rate 11/08/18 1431 99     Resp 11/08/18 1431 18  Temp 11/08/18 1431 98.9 F (37.2 C)     Temp Source 11/08/18 1431 Oral     SpO2 11/08/18 1431 97 %     Weight 11/08/18 1427 230 lb (104.3 kg)     Height 11/08/18 1427 5\' 8"  (1.727 m)     Head Circumference --      Peak Flow --      Pain Score 11/08/18 1427 0     Pain Loc --      Pain Edu? --      Excl. in [place]? --    No data found.  Updated Vital Signs BP 120/86 (BP Location: Left Arm)    Pulse 99    Temp 98.9 F (37.2 C) (Oral)    Resp 18    Ht 5\' 8"  (1.727 m)    Wt 230 lb (104.3 kg)    LMP 10/11/2018 (Approximate)    SpO2 97%    BMI 34.97 kg/m   Visual  Acuity Right Eye Distance:   Left Eye Distance:   Bilateral Distance:    Right Eye Near:   Left Eye Near:    Bilateral Near:     Physical Exam Vitals signs and nursing note reviewed.  Constitutional:      General: He is awake. He is not in acute distress.    Appearance: Normal appearance. He is well-developed and well-groomed. He is not ill-appearing.     Comments: Patient is sitting comfortably in exam chair in no acute distress.   HENT:     Head: Normocephalic and atraumatic.     Right Ear: Hearing and external ear normal.     Left Ear: Hearing and external ear normal.     Mouth/Throat:     Lips: Pink.     Mouth: Mucous membranes are moist.     Pharynx: Oropharynx is clear.  Eyes:     Extraocular Movements: Extraocular movements intact.     Conjunctiva/sclera: Conjunctivae normal.  Neck:     Musculoskeletal: Normal range of motion.  Cardiovascular:     Rate and Rhythm: Normal rate.  Pulmonary:     Effort: Pulmonary effort is normal. No respiratory distress.     Breath sounds: Normal breath sounds and air entry. No decreased breath sounds, wheezing or rhonchi.  Musculoskeletal: Normal range of motion.  Skin:    General: Skin is warm and dry.     Capillary Refill: Capillary refill takes less than 2 seconds.     Findings: Rash present. Rash is macular and papular. Rash is not crusting or vesicular.          Comments: Pink to red papular lesions present on both inner elbows with various maculopapular lesions present spreading down both inner arms. No discharge or crusting. No warmth. No signs of secondary bacterial infection. Good distal pulses and capillary refill.   Neurological:     General: No focal deficit present.     Mental Status: He is alert and oriented to person, place, and time.  Psychiatric:        Mood and Affect: Mood normal.        Behavior: Behavior normal. Behavior is cooperative.        Thought Content: Thought content normal.        Judgment: Judgment  normal.      UC Treatments / Results  Labs (all labs ordered are listed, but only abnormal results are displayed) Labs Reviewed - No data to display  EKG   Radiology No  results found.  Procedures Procedures (including critical care time)  Medications Ordered in UC Medications - No data to display  Initial Impression / Assessment and Plan / UC Course  I have reviewed the triage vital signs and the nursing notes.  Pertinent labs & imaging results that were available during my care of the patient were reviewed by me and considered in my medical decision making (see chart for details).    Discussed with patient that since rash is in the early stages will trial oral treatment only for now. Patient agrees and declines steroid shot today. Will start Prednisone 10mg  12 day dose pack as directed. May continue Claritin for itching. May take Benadryl at night if needed. Apply cool compresses to area for comfort. Try to avoid scratching rash. Follow-up in 3 to 5 days if not improving or sooner if worsening.  Final Clinical Impressions(s) / UC Diagnoses   Final diagnoses:  Allergic contact dermatitis due to plant     Discharge Instructions     Recommend start Prednisone 10mg  tablets- take 6 tablets today and tomorrow and then decrease by 1 tablet every 2 days until finished on day 12. Recommend apply cool compresses to area for comfort. Continue Claritin 10mg  daily for itching. Try to avoid scratching areas. Follow-up in 3 to 5 days if not improving.     ED Prescriptions    Medication Sig Dispense Auth. Provider   predniSONE (STERAPRED UNI-PAK 21 TAB) 10 MG (21) TBPK tablet Take by mouth daily. Take 6 tabs by mouth daily for 2 days, then decrease by 1 tablet every 2 days until finished on day 12 42 tablet Donneisha Beane, Ali Lowe, NP     Controlled Substance Prescriptions [place] Controlled Substance Registry consulted? Not Applicable   Sudie Grumbling, NP 11/09/18 [phone]

## 2018-11-08 NOTE — ED Triage Notes (Signed)
Pt has poison ivy on bilateral arms. Started about 4 days ago but started spreading yesterday. She has h/o bad reactions to poison ivy.

## 2018-11-08 NOTE — Discharge Instructions (Addendum)
Recommend start Prednisone 10mg  tablets- take 6 tablets today and tomorrow and then decrease by 1 tablet every 2 days until finished on day 12. Recommend apply cool compresses to area for comfort. Continue Claritin 10mg  daily for itching. Try to avoid scratching areas. Follow-up in 3 to 5 days if not improving.

## 2019-03-26 ENCOUNTER — Encounter: Payer: Self-pay | Admitting: Emergency Medicine

## 2019-03-26 ENCOUNTER — Ambulatory Visit
Admission: EM | Admit: 2019-03-26 | Discharge: 2019-03-26 | Disposition: A | Discharge status: Home / Self Care | Attending: Emergency Medicine | Admitting: Emergency Medicine

## 2019-03-26 ENCOUNTER — Other Ambulatory Visit: Payer: Self-pay

## 2019-03-26 DIAGNOSIS — B354 Tinea corporis: Secondary | ICD-10-CM

## 2019-03-26 HISTORY — DX: Unspecified osteoarthritis, unspecified site: M19.90

## 2019-03-26 HISTORY — DX: Other seasonal allergic rhinitis: J30.2

## 2019-03-26 MED ORDER — KETOCONAZOLE 2 % EX CREA: 1.0000 "application " | TOPICAL_CREAM | Freq: Every day | CUTANEOUS | 0 refills | Status: DC

## 2019-03-26 NOTE — ED Triage Notes (Signed)
Patient in today c/o a rash on her right wrist x 10 days. Patient has tried OTC Aveeno, Calamine, Iverest and triamcinolone without relief.

## 2019-03-26 NOTE — ED Provider Notes (Signed)
[facility]    CSN: [id number] Arrival date & time: 03/26/19  1616      History   Chief Complaint Chief Complaint  Patient presents with  . Rash    HPI Catherine Byrd is a 54 y.o. adult.   HPI  54 year old female presents with a rash that she has had on the volar surface radial aspect of her right wrist for 10 days.  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  This time the area is very itchy she has been using over-the-counter Aveeno calamine lotion Iverest and triamcinolone without relief.  The last 3 days she has been using an adhesive bandage.  She does have an allergy to adhesives.         Past Medical History:  Diagnosis Date  . Arthritis   . Seasonal allergies     There are no problems to display for this patient.   Past Surgical History:  Procedure Laterality Date  . BREAST SURGERY Left    lumpectomy    OB History   No obstetric history on file.      Home Medications    Prior to Admission medications   Medication Sig Start Date End Date Taking? Authorizing Provider  loratadine (CLARITIN) 10 MG tablet Take 1 tablet (10 mg total) by mouth daily. Take 1 tablet in the morning. As needed for itching. 03/01/15  Yes Hassan Rowan, MD  ketoconazole (NIZORAL) 2 % cream Apply 1 application topically daily. 03/26/19   Lutricia Feil, PA-C  predniSONE (STERAPRED UNI-PAK 21 TAB) 10 MG (21) TBPK tablet Take by mouth daily. Take 6 tabs by mouth daily for 2 days, then decrease by 1 tablet every 2 days until finished on day 12 11/08/18   Sudie Grumbling, NP  ranitidine (ZANTAC) 150 MG capsule Take 1 capsule (150 mg total) by mouth 2 (two) times daily. 03/01/15 11/08/18  Hassan Rowan, MD    Family History Family History  Problem Relation Age of Onset  . Cancer Mother        cervical  . Melanoma Father     Social History Social History   Tobacco Use  . Smoking  status: Never Smoker  . Smokeless tobacco: Never Used  Substance Use Topics  . Alcohol use: No  . Drug use: Not Currently     Allergies   No known allergies   Review of Systems Review of Systems  Constitutional: Positive for activity change. Negative for appetite change, chills, diaphoresis, fatigue and fever.  Skin: Positive for color change and rash.  All other systems reviewed and are negative.    Physical Exam Triage Vital Signs ED Triage Vitals  Enc Vitals Group     BP 03/26/19 1630 134/86     Pulse Rate 03/26/19 1630 86     Resp 03/26/19 1630 18     Temp 03/26/19 1630 98.4 F (36.9 C)     Temp Source 03/26/19 1630 Oral     SpO2 03/26/19 1630 100 %     Weight 03/26/19 1631 240 lb (108.9 kg)     Height 03/26/19 1631 5\' 8"  (1.727 m)     Head Circumference --      Peak Flow --      Pain Score 03/26/19 1630 0     Pain Loc --      Pain Edu? --      Excl.  in [place]? --    No data found.  Updated Vital Signs BP 134/86 (BP Location: Left Arm)   Pulse 86   Temp 98.4 F (36.9 C) (Oral)   Resp 18   Ht 5\' 8"  (1.727 m)   Wt 240 lb (108.9 kg)   LMP 03/19/2019 (Approximate)   SpO2 100%   BMI 36.49 kg/m   Visual Acuity Right Eye Distance:   Left Eye Distance:   Bilateral Distance:    Right Eye Near:   Left Eye Near:    Bilateral Near:     Physical Exam Vitals and nursing note reviewed.  Constitutional:      General: He is not in acute distress.    Appearance: Normal appearance. He is not ill-appearing, toxic-appearing or diaphoretic.  HENT:     Head: Normocephalic and atraumatic.  Eyes:     Conjunctiva/sclera: Conjunctivae normal.  Musculoskeletal:        General: Normal range of motion.     Cervical back: Normal range of motion and neck supple.  Skin:    Findings: Erythema and rash present.     Comments: Examination of the right wrist shows a circular rash with defined edges and central clearing in the middle.  Outer portion shows small vesicles but  still in a very defined pattern.  No other lesions on her body.  Refer to photographs for details  Neurological:     General: No focal deficit present.     Mental Status: He is alert and oriented to person, place, and time.  Psychiatric:        Mood and Affect: Mood normal.        Behavior: Behavior normal.        Thought Content: Thought content normal.        Judgment: Judgment normal.          UC Treatments / Results  Labs (all labs ordered are listed, but only abnormal results are displayed) Labs Reviewed - No data to display  EKG   Radiology No results found.  Procedures Procedures (including critical care time)  Medications Ordered in UC Medications - No data to display  Initial Impression / Assessment and Plan / UC Course  I have reviewed the triage vital signs and the nursing notes.  Pertinent labs & imaging results that were available during my care of the patient were reviewed by me and considered in my medical decision making (see chart for details).   54 year old female presents with a rash on her right volar radial aspect of wrist that she has had for 10 days.  She has tried numerous over-the-counter products and also triamcinolone without relief.  She has had an  adhesive bandage applied over top of the area for the last 3 days.  She tells me she is allergic to adhesive.  The rash appears to be ringworm with possible allergic reaction to the adhesive bandage.  We will place her on Nizoral cream that she will use on a daily basis.  Advised her to remove the adhesive bandages and use gauze for overlayment covered with Coban.  If she does not notice improvement in 1 week she should follow-up with her primary or else a dermatologist.  May use Claritin Allegra or Zyrtec for daytime itching and Benadryl at night.   Final Clinical Impressions(s) / UC Diagnoses   Final diagnoses:  Ringworm of body     Discharge Instructions     Do not use adhesive bandages on  the area.  Instead ,after applying the cream to the area wrap with gauze and then use the Coban I supplied to you .  If the area does not show improvement in a week return to our clinic or go to a dermatologist.  For itching you may use Claritin Zyrtec or Allegra during the day and Benadryl at nighttime    ED Prescriptions    Medication Sig Dispense Auth. Provider   ketoconazole (NIZORAL) 2 % cream Apply 1 application topically daily. 30 g Lutricia Feil, PA-C     PDMP not reviewed this encounter.   Lutricia Feil, PA-C 03/26/19 1715

## 2019-03-26 NOTE — Discharge Instructions (Addendum)
Do not use adhesive bandages on the area.  Instead ,after applying the cream to the area wrap with gauze and then use the Coban I supplied to you .  If the area does not show improvement in a week return to our clinic or go to a dermatologist.  For itching you may use Claritin Zyrtec or Allegra during the day and Benadryl at nighttime

## 2019-09-19 ENCOUNTER — Ambulatory Visit
Admission: EM | Admit: 2019-09-19 | Discharge: 2019-09-19 | Disposition: A | Discharge status: Home / Self Care | Attending: Internal Medicine | Admitting: Internal Medicine

## 2019-09-19 ENCOUNTER — Encounter: Payer: Self-pay | Admitting: Emergency Medicine

## 2019-09-19 ENCOUNTER — Other Ambulatory Visit: Payer: Self-pay

## 2019-09-19 DIAGNOSIS — L239 Allergic contact dermatitis, unspecified cause: Secondary | ICD-10-CM

## 2019-09-19 MED ORDER — METHYLPREDNISOLONE 4 MG PO TBPK: ORAL_TABLET | ORAL | 0 refills | Status: DC

## 2019-09-19 NOTE — ED Triage Notes (Signed)
Patient c/o itchy red rash on her forehead and face that started yesterday.

## 2019-09-19 NOTE — ED Provider Notes (Signed)
[facility]    CSN: [id number] Arrival date & time: 09/19/19  0841      History   Chief Complaint Chief Complaint  Patient presents with  . Rash    HPI Catherine Byrd is a 54 y.o. adult. who presents with onset of rash on her face sine yesterday. Denies rash anywhere else.  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 She does not recall passing by a plant that touched her face, but her animals roam around all over and usually gets it from them. OTC topical things never help.    Past Medical History:  Diagnosis Date  . Arthritis   . Seasonal allergies     There are no problems to display for this patient.   Past Surgical History:  Procedure Laterality Date  . BREAST SURGERY Left    lumpectomy    OB History   No obstetric history on file.      Home Medications    Prior to Admission medications   Medication Sig Start Date End Date Taking? Authorizing Provider  loratadine (CLARITIN) 10 MG tablet Take 1 tablet (10 mg total) by mouth daily. Take 1 tablet in the morning. As needed for itching. 03/01/15  Yes Hassan Rowan, MD  methylPREDNISolone (MEDROL DOSEPAK) 4 MG TBPK tablet As directed 09/19/19   Rodriguez-Southworth, Nettie Elm, PA-C  ranitidine (ZANTAC) 150 MG capsule Take 1 capsule (150 mg total) by mouth 2 (two) times daily. 03/01/15 11/08/18  Hassan Rowan, MD    Family History Family History  Problem Relation Age of Onset  . Cancer Mother        cervical  . Melanoma Father     Social History Social History   Tobacco Use  . Smoking status: Never Smoker  . Smokeless tobacco: Never Used  Vaping Use  . Vaping Use: Never used  Substance Use Topics  . Alcohol use: No  . Drug use: Not Currently     Allergies   No known allergies   Review of Systems Review of Systems  Skin: Positive for rash. Negative for wound.       + itching  Neurological: Negative for numbness.  Hematological: Negative for adenopathy.      Physical Exam Triage Vital Signs ED Triage Vitals  Enc Vitals Group     BP 09/19/19 0848 (!) 128/94     Pulse Rate 09/19/19 0848 86     Resp 09/19/19 0848 14     Temp 09/19/19 0848 98.4 F (36.9 C)     Temp Source 09/19/19 0848 Oral     SpO2 09/19/19 0848 100 %     Weight 09/19/19 0846 (!) 235 lb (106.6 kg)     Height 09/19/19 0846 5\' 8"  (1.727 m)     Head Circumference --      Peak Flow --      Pain Score 09/19/19 0846 0     Pain Loc --      Pain Edu? --      Excl. in [place]? --    No data found.  Updated Vital Signs BP (!) 128/94 (BP Location: Left Arm)   Pulse 86   Temp 98.4 F (36.9 C) (Oral)   Resp 14   Ht 5\' 8"  (1.727 m)   Wt (!) 235 lb (106.6 kg)   LMP 08/28/2019 (Approximate)   SpO2 100%   BMI 35.73 kg/m   Visual Acuity Right Eye Distance:  Left Eye Distance:   Bilateral Distance:    Right Eye Near:   Left Eye Near:    Bilateral Near:     Physical Exam Vitals and nursing note reviewed.  Constitutional:      General: He is not in acute distress.    Appearance: He is not toxic-appearing.  HENT:     Head: Atraumatic.     Comments: Her forehead has fine vesicular rash all across with slight erythema. It is also present on her R face    Right Ear: External ear normal.     Left Ear: External ear normal.     Nose: Nose normal.     Comments: Has fine vesicular rash over her nose bridge Eyes:     General: No scleral icterus.    Conjunctiva/sclera: Conjunctivae normal.  Pulmonary:     Effort: Pulmonary effort is normal.  Musculoskeletal:        General: Normal range of motion.     Cervical back: Neck supple.  Skin:    General: Skin is warm and dry.     Comments: No rash noted on arms or neck.   Neurological:     Mental Status: He is alert and oriented to person, place, and time.     Gait: Gait normal.  Psychiatric:        Mood and Affect: Mood normal.        Behavior: Behavior normal.        Thought Content: Thought content normal.         Judgment: Judgment normal.    UC Treatments / Results  Labs (all labs ordered are listed, but only abnormal results are displayed) Labs Reviewed - No data to display  EKG   Radiology No results found.  Procedures Procedures (including critical care time)  Medications Ordered in UC Medications - No data to display  Initial Impression / Assessment and Plan / UC Course  I have reviewed the triage vital signs and the nursing notes. Has contact dermatitis. She was placed on Medrol dose pack.   Final Clinical Impressions(s) / UC Diagnoses   Final diagnoses:  Allergic contact dermatitis, unspecified trigger   Discharge Instructions   None    ED Prescriptions    Medication Sig Dispense Auth. Provider   methylPREDNISolone (MEDROL DOSEPAK) 4 MG TBPK tablet As directed 21 tablet Rodriguez-Southworth, Nettie Elm, PA-C     PDMP not reviewed this encounter.   Garey Ham, [place] 09/19/19 [id number]

## 2019-09-22 ENCOUNTER — Other Ambulatory Visit: Payer: Self-pay

## 2019-09-22 ENCOUNTER — Ambulatory Visit
Admission: EM | Admit: 2019-09-22 | Discharge: 2019-09-22 | Disposition: A | Discharge status: Home / Self Care | Attending: Family Medicine | Admitting: Family Medicine

## 2019-09-22 DIAGNOSIS — M25562 Pain in left knee: Secondary | ICD-10-CM | POA: Diagnosis not present

## 2019-09-22 MED ORDER — MELOXICAM 15 MG PO TABS: 15.0000 mg | ORAL_TABLET | Freq: Every day | ORAL | 0 refills | Status: AC

## 2019-09-22 NOTE — ED Provider Notes (Signed)
[facility]    CSN: [id number] Arrival date & time: 09/22/19  1044      History   Chief Complaint Chief Complaint  Patient presents with   Knee Pain    left   HPI  54 year old female presents with the above complaint.  Patient reports that 3 weeks ago she was head butted by a goat to the left knee.  Yesterday she went to run and felt a pop on the lateral aspect of her left knee.  She reports difficulty with activity/ambulation.  She reports knee swelling.  Has been having difficulty sleeping as well.  Pain 8/10 in severity.  No relieving factors.  Has known osteoarthritis.  No other reported injuries.  No other complaints.  Past Medical History:  Diagnosis Date   Arthritis    Seasonal allergies    Past Surgical History:  Procedure Laterality Date   BREAST SURGERY Left    lumpectomy   OB History   No obstetric history on file.    Home Medications    Prior to Admission medications   Medication Sig Start Date End Date Taking? Authorizing Provider  loratadine (CLARITIN) 10 MG tablet Take 1 tablet (10 mg total) by mouth daily. Take 1 tablet in the morning. As needed for itching. 03/01/15  Yes Hassan Rowan, MD  methylPREDNISolone (MEDROL DOSEPAK) 4 MG TBPK tablet As directed 09/19/19  Yes Rodriguez-Southworth, Nettie Elm, PA-C  meloxicam (MOBIC) 15 MG tablet Take 1 tablet (15 mg total) by mouth daily for 14 days. 09/22/19 10/06/19  Tommie Sams, DO  ranitidine (ZANTAC) 150 MG capsule Take 1 capsule (150 mg total) by mouth 2 (two) times daily. 03/01/15 11/08/18  Hassan Rowan, MD    Family History Family History  Problem Relation Age of Onset   Cancer Mother        cervical   Melanoma Father     Social History Social History   Tobacco Use   Smoking status: Never Smoker   Smokeless tobacco: Never Used  Vaping Use   Vaping Use: Never used  Substance Use Topics   Alcohol use: No   Drug use: Not Currently     Allergies   No known  allergies   Review of Systems Review of Systems   Physical Exam Triage Vital Signs ED Triage Vitals  Enc Vitals Group     BP 09/22/19 1144 (!) 125/94     Pulse Rate 09/22/19 1144 74     Resp 09/22/19 1144 18     Temp 09/22/19 1144 98 F (36.7 C)     Temp Source 09/22/19 1144 Oral     SpO2 09/22/19 1144 100 %     Weight 09/22/19 1142 (!) 235 lb (106.6 kg)     Height 09/22/19 1142 5\' 8"  (1.727 m)     Head Circumference --      Peak Flow --      Pain Score 09/22/19 1142 8     Pain Loc --      Pain Edu? --      Excl. in GC? --    Updated Vital Signs BP (!) 125/94 (BP Location: Left Arm)    Pulse 74    Temp 98 F (36.7 C) (Oral)    Resp 18    Ht 5\' 8"  (1.727 m)    Wt (!) 106.6 kg    LMP 09/22/2019 (Exact Date)    SpO2 100%    BMI 35.73 kg/m   Visual Acuity Right Eye Distance:  Left Eye Distance:   Bilateral Distance:    Right Eye Near:   Left Eye Near:    Bilateral Near:     Physical Exam Vitals and nursing note reviewed.  Constitutional:      General: She is not in acute distress.    Appearance: Normal appearance. She is not ill-appearing.  HENT:     Head: Normocephalic and atraumatic.  Eyes:     General:        Right eye: No discharge.        Left eye: No discharge.     Conjunctiva/sclera: Conjunctivae normal.  Pulmonary:     Effort: Pulmonary effort is normal. No respiratory distress.  Musculoskeletal:     Comments: Left knee -mild swelling noted.  No joint line tenderness.  Ligaments intact.  Neurological:     Mental Status: She is alert.  Psychiatric:        Mood and Affect: Mood normal.        Behavior: Behavior normal.    UC Treatments / Results  Labs (all labs ordered are listed, but only abnormal results are displayed) Labs Reviewed - No data to display  EKG   Radiology No results found.  Procedures Procedures (including critical care time)  Medications Ordered in UC Medications - No data to display  Initial Impression / Assessment  and Plan / UC Course  I have reviewed the triage vital signs and the nursing notes.  Pertinent labs & imaging results that were available during my care of the patient were reviewed by me and considered in my medical decision making (see chart for details).    54 year old female presents with lateral knee pain.  Suspect underlying osteoarthritis and LCL sprain.  Advised rest, ice, elevation.  Meloxicam as directed.  Will need to see orthopedics if persist as will need MRI.  Final Clinical Impressions(s) / UC Diagnoses   Final diagnoses:  Pain in lateral portion of left knee     Discharge Instructions     Rest, ice, elevate.  Medication as directed.   If persists or worsen, see [facility] [phone]) OR [facility] [phone])  Best of luck  Dr. Adriana Simas     ED Prescriptions    Medication Sig Dispense Auth. Provider   meloxicam (MOBIC) 15 MG tablet Take 1 tablet (15 mg total) by mouth daily for 14 days. 30 tablet Tommie Sams, DO     PDMP not reviewed this encounter.   Tommie Sams, [place] 09/22/19 1451

## 2019-09-22 NOTE — ED Triage Notes (Signed)
Patient states that she has been having left knee pain that started yesterday when she went to [employer]. States that 3 weeks ago she was head butted by her goat in this same knee. States that she cannot bear total weight on the knee. Patient states that knee has been aching and throbbing all night.

## 2019-09-22 NOTE — Discharge Instructions (Signed)
Rest, ice, elevate.  Medication as directed.   If persists or worsen, see [facility] [phone]) OR [facility] [phone])  Best of luck  Dr. Adriana Simas

## 2019-10-19 ENCOUNTER — Other Ambulatory Visit: Payer: Self-pay | Admitting: Family Medicine

## 2023-08-01 ENCOUNTER — Ambulatory Visit
Admission: EM | Admit: 2023-08-01 | Discharge: 2023-08-01 | Disposition: A | Discharge status: Home / Self Care | Attending: Emergency Medicine | Admitting: Emergency Medicine

## 2023-08-01 ENCOUNTER — Encounter: Payer: Self-pay | Admitting: Emergency Medicine

## 2023-08-01 DIAGNOSIS — L247 Irritant contact dermatitis due to plants, except food: Secondary | ICD-10-CM | POA: Diagnosis not present

## 2023-08-01 MED ORDER — DEXAMETHASONE SODIUM PHOSPHATE 10 MG/ML IJ SOLN
10.0000 mg | Freq: Once | INTRAMUSCULAR | Status: AC
  Administered 2023-08-01: 10 mg via INTRAMUSCULAR

## 2023-08-01 MED ORDER — PREDNISONE 10 MG (21) PO TBPK: ORAL_TABLET | Freq: Every day | ORAL | 0 refills | Status: DC

## 2023-08-01 NOTE — ED Provider Notes (Signed)
 [facility]    CSN: [id number] Arrival date & time: 08/01/23  1536      History   Chief Complaint Chief Complaint  Patient presents with   Rash    HPI Catherine Byrd is a 58 y.o. female.   HPI  58 year old female with past medical significant for arthritis and seasonal allergies presents for evaluation of poison ivy rash on the inner aspect of her right arm that started 5 days ago.  Patient has been using Ivy rest and washing with poison ivy Lye soap without improvement of symptoms.  She denies any respiratory symptoms.  Past Medical History:  Diagnosis Date   Arthritis    Seasonal allergies     There are no active problems to display for this patient.   Past Surgical History:  Procedure Laterality Date   BREAST SURGERY Left    lumpectomy    OB History   No obstetric history on file.      Home Medications    Prior to Admission medications   Medication Sig Start Date End Date Taking? Authorizing Provider  predniSONE  (STERAPRED UNI-PAK 21 TAB) 10 MG (21) TBPK tablet Take by mouth daily. Take 6 tabs by mouth daily  for 2 days, then 5 tabs for 2 days, then 4 tabs for 2 days, then 3 tabs for 2 days, 2 tabs for 2 days, then 1 tab by mouth daily for 2 days 08/01/23  Yes Kent Pear, NP  loratadine  (CLARITIN ) 10 MG tablet Take 1 tablet (10 mg total) by mouth daily. Take 1 tablet in the morning. As needed for itching. 03/01/15   Sharren Decree, MD  ranitidine  (ZANTAC ) 150 MG capsule Take 1 capsule (150 mg total) by mouth 2 (two) times daily. 03/01/15 11/08/18  Sharren Decree, MD    Family History Family History  Problem Relation Age of Onset   Cancer Mother        cervical   Melanoma Father     Social History Social History   Tobacco Use   Smoking status: Never   Smokeless tobacco: Never  Vaping Use   Vaping status: Never Used  Substance Use Topics   Alcohol use: No   Drug use: Not Currently     Allergies   No known allergies   Review of  Systems Review of Systems  Respiratory:  Negative for chest tightness, shortness of breath and wheezing.   Skin:  Positive for color change and rash.     Physical Exam Triage Vital Signs ED Triage Vitals  Encounter Vitals Group     BP      Systolic BP Percentile      Diastolic BP Percentile      Pulse      Resp      Temp      Temp src      SpO2      Weight      Height      Head Circumference      Peak Flow      Pain Score      Pain Loc      Pain Education      Exclude from Growth Chart    No data found.  Updated Vital Signs BP 132/78 (BP Location: Left Arm)   Pulse 71   Temp 98.7 F (37.1 C) (Oral)   Resp 16   LMP 09/22/2019 (Exact Date)   SpO2 100%   Visual Acuity Right Eye Distance:   Left Eye  Distance:   Bilateral Distance:    Right Eye Near:   Left Eye Near:    Bilateral Near:     Physical Exam Vitals and nursing note reviewed.  Constitutional:      Appearance: Normal appearance. She is not ill-appearing.  HENT:     Head: Normocephalic and atraumatic.  Skin:    General: Skin is warm and dry.     Capillary Refill: Capillary refill takes less than 2 seconds.     Findings: Erythema and rash present.  Neurological:     General: No focal deficit present.     Mental Status: She is alert and oriented to person, place, and time.      UC Treatments / Results  Labs (all labs ordered are listed, but only abnormal results are displayed) Labs Reviewed - No data to display  EKG   Radiology No results found.  Procedures Procedures (including critical care time)  Medications Ordered in UC Medications  dexamethasone (DECADRON) injection 10 mg (has no administration in time range)    Initial Impression / Assessment and Plan / UC Course  I have reviewed the triage vital signs and the nursing notes.  Pertinent labs & imaging results that were available during my care of the patient were reviewed by me and considered in my medical decision making  (see chart for details).   Patient is a very pleasant, nontoxic-appearing 58 year old female presenting for evaluation of contact dermatitis to the inner aspect of her right elbow.  She reports that she was trimming bushes 5 days ago and came into contact with some poison ivy.  She reports that she washed off really well afterwards but she still developed this rash on the inner aspect of her right elbow.  She has noticed that there is some new red streaks forming on the anterior part of her right upper arm.  She has a significant allergy to poison ivy and typically requires several rounds of steroids.  I will treat her for contact dermatitis with 10 mg of IM Decadron here in clinic and discharge her home on a 12-day prednisone  taper.  She may use over-the-counter antihistamine such as Claritin , Zyrtec, or Allegra during the day to help with itching along with Benadryl at nighttime.  ER return precautions reviewed.   Final Clinical Impressions(s) / UC Diagnoses   Final diagnoses:  Irritant contact dermatitis due to plants, except food     Discharge Instructions      Take the prednisone  according to the package instructions.  Use over-the-counter Allegra, Claritin , or Zyrtec during the day as needed for itching and use Benadryl 50 mg at bedtime.  This may also help you sleep as a steroids may interrupt your sleep cycle.  Apply calamine lotion to the rash on your extremities to help dry it up.  Do not use calamine lotion on your face.  For facial lesions, if you develop any changes in your vision or itching and irritation in your eyes please go to the ER for evaluation or follow-up with ophthalmology.    ED Prescriptions     Medication Sig Dispense Auth. Provider   predniSONE  (STERAPRED UNI-PAK 21 TAB) 10 MG (21) TBPK tablet Take by mouth daily. Take 6 tabs by mouth daily  for 2 days, then 5 tabs for 2 days, then 4 tabs for 2 days, then 3 tabs for 2 days, 2 tabs for 2 days, then 1 tab by  mouth daily for 2 days 42 tablet Kent Pear, NP  PDMP not reviewed this encounter.   Kent Pear, NP 08/01/23 [phone]

## 2023-08-01 NOTE — Discharge Instructions (Addendum)
Take the prednisone according to the package instructions.  Use over-the-counter Allegra, Claritin, or Zyrtec during the day as needed for itching and use Benadryl 50 mg at bedtime.  This may also help you sleep as a steroids may interrupt your sleep cycle.  Apply calamine lotion to the rash on your extremities to help dry it up.  Do not use calamine lotion on your face.  For facial lesions, if you develop any changes in your vision or itching and irritation in your eyes please go to the ER for evaluation or follow-up with ophthalmology.  

## 2023-08-01 NOTE — ED Triage Notes (Signed)
 Pt presents with a rash on her right arm x 5 days.

## 2023-08-31 ENCOUNTER — Ambulatory Visit: Admission: EM | Admit: 2023-08-31 | Discharge: 2023-08-31 | Disposition: A | Discharge status: Home / Self Care

## 2023-08-31 ENCOUNTER — Encounter: Payer: Self-pay | Admitting: Emergency Medicine

## 2023-08-31 DIAGNOSIS — L309 Dermatitis, unspecified: Secondary | ICD-10-CM

## 2023-08-31 MED ORDER — PREDNISONE 10 MG PO TABS: ORAL_TABLET | ORAL | 0 refills | Status: DC

## 2023-08-31 NOTE — ED Triage Notes (Signed)
 Patient reports itchy rash on her left hands for a week.  Patient went to UC on Thursday and got a steroid injection that day.  Patient reports that rash has spread to her other hand.

## 2023-08-31 NOTE — ED Provider Notes (Signed)
 [facility]    CSN: [id number] Arrival date & time: 08/31/23  1002      History   Chief Complaint Chief Complaint  Patient presents with   Rash    HPI Catherine Byrd is a 58 y.o. female  presenting for pruritic rash of the left hand for the past 1 week.  Patient thinks she was potentially exposed to poison ivy but is not sure.  She operates a farm.  She also is around a lot of cats and her neighbor's dog.  Patient was seen at a different urgent care 2 days ago for the rash and was given a dexamethasone  injection.  She also has used topical triamcinolone cream.  She states symptoms initially improved but over the past day they have worsened and she now has increased swelling.  HPI  Past Medical History:  Diagnosis Date   Arthritis    Seasonal allergies     There are no active problems to display for this patient.   Past Surgical History:  Procedure Laterality Date   BREAST SURGERY Left    lumpectomy    OB History   No obstetric history on file.      Home Medications    Prior to Admission medications   Medication Sig Start Date End Date Taking? Authorizing Provider  predniSONE  (DELTASONE ) 10 MG tablet Take 6 tabs po on day 1 and decrease by 1 tab daily until complete 08/31/23  Yes Arvis Huxley B, PA-C  triamcinolone cream (KENALOG) 0.1 % Apply 1 Application topically 2 (two) times daily. 04/15/18 09/08/23 Yes [provider]  loratadine  (CLARITIN ) 10 MG tablet Take 1 tablet (10 mg total) by mouth daily. Take 1 tablet in the morning. As needed for itching. 03/01/15   Desiderio Beagle, MD  ranitidine  (ZANTAC ) 150 MG capsule Take 1 capsule (150 mg total) by mouth 2 (two) times daily. 03/01/15 11/08/18  Desiderio Beagle, MD    Family History Family History  Problem Relation Age of Onset   Cancer Mother        cervical   Melanoma Father     Social History Social History   Tobacco Use   Smoking status: Never   Smokeless tobacco: Never  Vaping Use    Vaping status: Never Used  Substance Use Topics   Alcohol use: No   Drug use: Not Currently     Allergies   No known allergies   Review of Systems Review of Systems  Musculoskeletal:  Positive for joint swelling. Negative for arthralgias.  Skin:  Positive for rash.  Neurological:  Negative for weakness and numbness.     Physical Exam Triage Vital Signs ED Triage Vitals  Encounter Vitals Group     BP 08/31/23 1113 (!) 130/98     Girls Systolic BP Percentile --      Girls Diastolic BP Percentile --      Boys Systolic BP Percentile --      Boys Diastolic BP Percentile --      Pulse Rate 08/31/23 1113 71     Resp 08/31/23 1113 14     Temp 08/31/23 1113 97.9 F (36.6 C)     Temp Source 08/31/23 1113 Oral     SpO2 08/31/23 1113 100 %     Weight 08/31/23 1111 235 lb 0.2 oz (106.6 kg)     Height 08/31/23 1111 5' 8 (1.727 m)     Head Circumference --      Peak Flow --  Pain Score 08/31/23 1111 3     Pain Loc --      Pain Education --      Exclude from Growth Chart --    No data found.  Updated Vital Signs BP (!) 130/98 (BP Location: Left Arm)   Pulse 71   Temp 97.9 F (36.6 C) (Oral)   Resp 14   Ht 5' 8 (1.727 m)   Wt 235 lb 0.2 oz (106.6 kg)   LMP 09/22/2019 (Exact Date)   SpO2 100%   BMI 35.73 kg/m    Physical Exam Vitals and nursing note reviewed.  Constitutional:      General: She is not in acute distress.    Appearance: Normal appearance. She is not ill-appearing or toxic-appearing.  HENT:     Head: Normocephalic and atraumatic.  Eyes:     General: No scleral icterus.       Right eye: No discharge.        Left eye: No discharge.     Conjunctiva/sclera: Conjunctivae normal.  Cardiovascular:     Rate and Rhythm: Normal rate.  Pulmonary:     Effort: Pulmonary effort is normal. No respiratory distress.  Musculoskeletal:     Cervical back: Neck supple.  Skin:    General: Skin is dry.     Findings: Rash (mild erythematous dry rash of dorsal  left hand and fingers) present.  Neurological:     General: No focal deficit present.     Mental Status: She is alert. Mental status is at baseline.     Motor: No weakness.     Gait: Gait normal.  Psychiatric:        Mood and Affect: Mood normal.        Behavior: Behavior normal.      UC Treatments / Results  Labs (all labs ordered are listed, but only abnormal results are displayed) Labs Reviewed - No data to display  EKG   Radiology No results found.  Procedures Procedures (including critical care time)  Medications Ordered in UC Medications - No data to display  Initial Impression / Assessment and Plan / UC Course  I have reviewed the triage vital signs and the nursing notes.  Pertinent labs & imaging results that were available during my care of the patient were reviewed by me and considered in my medical decision making (see chart for details).   58 year old female presents for 1 week history of rash of left hand.  Received corticosteroid injection 2 days ago and topical triamcinolone cream which has not been very effective.  Patient would like to take oral prednisone  taper.  On evaluation, the rash is mild to the patient is concerned about the swelling.  Hand dermatitis.  Prescribed prednisone  taper.  Continue topical.  Advise cool compresses and can take Benadryl.  Reviewed return precautions.   Final Clinical Impressions(s) / UC Diagnoses   Final diagnoses:  Hand dermatitis     Discharge Instructions      - Continue topical corticosteroid cream.  Try to use a topical sensitive skin lotion to keep your hands moisturized especially since you are sensitive frequently. - Start the prednisone  taper. - Continue Benadryl and cool compresses.     ED Prescriptions     Medication Sig Dispense Auth. Provider   predniSONE  (DELTASONE ) 10 MG tablet Take 6 tabs po on day 1 and decrease by 1 tab daily until complete 21 tablet Arvis Jolan NOVAK, PA-C      PDMP not  reviewed this encounter.   Arvis Jolan NOVAK, PA-C 08/31/23 1214

## 2023-08-31 NOTE — Discharge Instructions (Addendum)
-   Continue topical corticosteroid cream.  Try to use a topical sensitive skin lotion to keep your hands moisturized especially since you are sensitive frequently. - Start the prednisone  taper. - Continue Benadryl and cool compresses.

## 2023-09-18 ENCOUNTER — Ambulatory Visit
Admission: EM | Admit: 2023-09-18 | Discharge: 2023-09-18 | Disposition: A | Discharge status: Home / Self Care | Attending: Family Medicine | Admitting: Family Medicine

## 2023-09-18 DIAGNOSIS — T63461A Toxic effect of venom of wasps, accidental (unintentional), initial encounter: Secondary | ICD-10-CM | POA: Diagnosis not present

## 2023-09-18 DIAGNOSIS — L299 Pruritus, unspecified: Secondary | ICD-10-CM | POA: Diagnosis not present

## 2023-09-18 MED ORDER — HYDROXYZINE HCL 25 MG PO TABS: 25.0000 mg | ORAL_TABLET | Freq: Three times a day (TID) | ORAL | 0 refills | Status: AC | PRN

## 2023-09-18 MED ORDER — PREDNISONE 10 MG PO TABS: ORAL_TABLET | ORAL | 0 refills | Status: AC

## 2023-09-18 MED ORDER — DEXAMETHASONE SODIUM PHOSPHATE 10 MG/ML IJ SOLN
10.0000 mg | Freq: Once | INTRAMUSCULAR | Status: AC
  Administered 2023-09-18: 10 mg via INTRAMUSCULAR

## 2023-09-18 NOTE — Discharge Instructions (Signed)
 Stop by the pharmacy to pick up your prescriptions.  Follow up with your primary care provider or return to the urgent care, if not improving.

## 2023-09-18 NOTE — ED Provider Notes (Signed)
 [facility]    CSN: [id number] Arrival date & time: 09/18/23  9180      History   Chief Complaint Chief Complaint  Patient presents with   Insect Bite    HPI Catherine Byrd is a 58 y.o. female.   HPI   Catherine Byrd presents for itching after getting stung by multiple yellow jackets while in her barn last night. They went up her pants. She has multiple stings on her arms and legs.  Took 2 Benadryl as soon as it happened last night. She applied the topical steroid but that is not helping the itch.      Facial swelling: no Throat swelling: no Abdominal pain: no  Nausea/Vomiting/Diarrhea: no Palpitations: no Chest pain/discomfort: no Rash: only in areas of stings  Cyanosis: no Headache: no      Past Medical History:  Diagnosis Date   Arthritis    Seasonal allergies     There are no active problems to display for this patient.   Past Surgical History:  Procedure Laterality Date   BREAST SURGERY Left    lumpectomy    OB History   No obstetric history on file.      Home Medications    Prior to Admission medications   Medication Sig Start Date End Date Taking? Authorizing Provider  hydrOXYzine  (ATARAX ) 25 MG tablet Take 1 tablet (25 mg total) by mouth every 8 (eight) hours as needed. 09/18/23  Yes Hildur Bayer, DO  loratadine  (CLARITIN ) 10 MG tablet Take 1 tablet (10 mg total) by mouth daily. Take 1 tablet in the morning. As needed for itching. 03/01/15   Desiderio Beagle, MD  predniSONE  (DELTASONE ) 10 MG tablet Take 6 tabs by mouth daily for 1, then 5 tabs for 1 day, then 4 tabs for 1 day, then 3 tabs for 1 day, then 2 tabs for 1 day, then 1 tab for 1 day. 09/18/23   Shahmeer Bunn, DO  ranitidine  (ZANTAC ) 150 MG capsule Take 1 capsule (150 mg total) by mouth 2 (two) times daily. 03/01/15 11/08/18  Desiderio Beagle, MD    Family History Family History  Problem Relation Age of Onset   Cancer Mother        cervical   Melanoma Father     Social  History Social History   Tobacco Use   Smoking status: Never    Passive exposure: Never   Smokeless tobacco: Never  Vaping Use   Vaping status: Never Used  Substance Use Topics   Alcohol use: No   Drug use: Not Currently     Allergies   No known allergies and Poison ivy extract   Review of Systems Review of Systems: negative unless otherwise stated in HPI.      Physical Exam Triage Vital Signs ED Triage Vitals  Encounter Vitals Group     BP 09/18/23 0828 (!) 147/85     Girls Systolic BP Percentile --      Girls Diastolic BP Percentile --      Boys Systolic BP Percentile --      Boys Diastolic BP Percentile --      Pulse Rate 09/18/23 0828 79     Resp 09/18/23 0828 17     Temp 09/18/23 0828 98.4 F (36.9 C)     Temp Source 09/18/23 0828 Oral     SpO2 09/18/23 0828 96 %     Weight --      Height --      Head Circumference --  Peak Flow --      Pain Score 09/18/23 0826 0     Pain Loc --      Pain Education --      Exclude from Growth Chart --    No data found.  Updated Vital Signs BP (!) 147/85 (BP Location: Left Arm)   Pulse 79   Temp 98.4 F (36.9 C) (Oral)   Resp 17   SpO2 96%   Visual Acuity Right Eye Distance:   Left Eye Distance:   Bilateral Distance:    Right Eye Near:   Left Eye Near:    Bilateral Near:     Physical Exam GEN:     alert, nontoxic appearing femaleand no distress    HENT:  mucus membranes moist, no nasal discharge EYES:   no scleral injection,  EOM intact NECK:  supple, normal ROM RESP:  clear to auscultation bilaterally CVS:   regular EXT:   normal ROM, atraumatic, no edema  NEURO:  alert, oriented, speech normal, normal coordination Skin:   warm and dry, erythematous patches on upper and lower extremities, no distal cyanosis       UC Treatments / Results  Labs (all labs ordered are listed, but only abnormal results are displayed) Labs Reviewed - No data to display  EKG   Radiology No results  found.  Procedures Procedures (including critical care time)  Medications Ordered in UC Medications  dexamethasone  (DECADRON ) injection 10 mg (10 mg Intramuscular Given 09/18/23 0847)    Initial Impression / Assessment and Plan / UC Course  I have reviewed the triage vital signs and the nursing notes.  Pertinent labs & imaging results that were available during my care of the patient were reviewed by me and considered in my medical decision making (see chart for details).       Patient is a 58 y.o. female  who presents for pruritus after getting stung by multiple yellowjacket's last night in her barn.  Overall patient is nontoxic-appearing and afebrile.  Vital signs stable. [name] has localized erythematous patches otherwise exam is unremarkable. There has been  no facial or oral swelling, nausea, vomiting, palpitation, cyanosis, tachycardia or headache to suggest anaphylaxis.  Patient requesting steroid injection.  Given Decadron  10 mg IM. No EPI indicated at this time.  Discussed epi prescription and patient declined.  States she does not feel like she needs it.  Pt stable for discharge.  Claritin  10 mg twice daily for itching.  Hydroxyzine  for itching.  Continue steroid ointment.  Prednisone  taper prescribed.  Anaphylaxis precautions given and she voiced understanding.  ED and return precautions given and patient/guardian voiced understanding. Discussed MDM, treatment plan and plan for follow-up with patient who agrees with plan.    Final Clinical Impressions(s) / UC Diagnoses   Final diagnoses:  Wasp sting, accidental or unintentional, initial encounter  Pruritus     Discharge Instructions      Stop by the pharmacy to pick up your prescriptions.  Follow up with your primary care provider or return to the urgent care, if not improving.       ED Prescriptions     Medication Sig Dispense Auth. Provider   predniSONE  (DELTASONE ) 10 MG tablet Take 6 tabs by mouth daily for  1, then 5 tabs for 1 day, then 4 tabs for 1 day, then 3 tabs for 1 day, then 2 tabs for 1 day, then 1 tab for 1 day. 21 tablet [name], DO   hydrOXYzine  (ATARAX )  25 MG tablet Take 1 tablet (25 mg total) by mouth every 8 (eight) hours as needed. 30 tablet [name], DO      PDMP not reviewed this encounter.   Damian Hofstra, DO 09/18/23 [phone]

## 2023-09-18 NOTE — ED Triage Notes (Signed)
 Patient states that she stepped on a yellow jacket nest last night. Stings on legs and arms. No throat tightness or swelling.
